# Patient Record
Sex: Female | Born: 1980 | Race: White | Hispanic: Yes | Marital: Married | Smoking: Never smoker
Health system: Southern US, Community
[De-identification: ages and names within clinical notes are randomized; demographics above are authoritative.]

---

## 2013-06-30 DIAGNOSIS — Z9289 Personal history of other medical treatment: Secondary | ICD-10-CM | POA: Insufficient documentation

## 2021-11-13 ENCOUNTER — Other Ambulatory Visit: Payer: Self-pay

## 2021-11-13 DIAGNOSIS — Z1231 Encounter for screening mammogram for malignant neoplasm of breast: Secondary | ICD-10-CM

## 2021-12-27 ENCOUNTER — Ambulatory Visit
Admission: RE | Admit: 2021-12-27 | Discharge: 2021-12-27 | Disposition: A | Discharge status: Home / Self Care | Payer: No Typology Code available for payment source | Source: Ambulatory Visit | Attending: Obstetrics and Gynecology | Admitting: Obstetrics and Gynecology

## 2021-12-27 ENCOUNTER — Other Ambulatory Visit: Payer: Self-pay

## 2021-12-27 ENCOUNTER — Ambulatory Visit: Payer: Self-pay | Admitting: *Deleted

## 2021-12-27 VITALS — BP 128/80 | Wt 202.6 lb

## 2021-12-27 DIAGNOSIS — Z1231 Encounter for screening mammogram for malignant neoplasm of breast: Secondary | ICD-10-CM

## 2021-12-27 DIAGNOSIS — Z01419 Encounter for gynecological examination (general) (routine) without abnormal findings: Secondary | ICD-10-CM

## 2021-12-27 NOTE — Patient Instructions (Signed)
Explained breast self awareness with Cassandra Trevino. Pap smear completed today. Let her know BCCCP will cover Pap smears and HPV typing every 5 years unless has a history of abnormal Pap smears. Referred patient to the [facility] Center of [facility] for a screening mammogram on the mobile unit. Appointment scheduled Thursday, December 27, 2021 at 1000. Patient aware of appointment and will be there. Let patient know will follow up with her within the next couple weeks with results of Pap smear by phone. Informed patient that the Breast Center will follow up with her within the next couple of weeks with results of her mammogram by letter or phone. Cassandra Trevino verbalized understanding. ? ?Gilbert Manolis, Kathaleen Maser, RN ?9:42 AM ? ? ? ? ?

## 2021-12-27 NOTE — Progress Notes (Signed)
Ms. Gardiner Coins is a 41 y.o. No obstetric history on file. female who presents to [facility] clinic today with no complaints.  ?  ?Pap Smear: Pap smear completed today. Last Pap smear was in 2015 at [facility] clinic and was normal per patient. Per patient has no history of an abnormal Pap smear. Last Pap smear result is not available in Epic. ?  ?Physical exam: ?Breasts ?Left breast slightly larger than right breast that per patient is normal for her. No skin abnormalities bilateral breasts. No nipple retraction bilateral breasts. No nipple discharge bilateral breasts. No lymphadenopathy. No lumps palpated bilateral breasts. No complaints of pain or tenderness on exam.     ?  ?Pelvic/Bimanual ?Ext Genitalia ?No lesions, no swelling and no discharge observed on external genitalia. External genitalia slightly reddened.      ?  ?Vagina ?Vagina pink and normal texture. No lesions and blood observed in vagina consistent with patient is currently on menstrual period.      ?  ?Cervix ?Cervix is present. Cervix pink and of normal texture. Blood observed on cervix. ?  ?Uterus ?Uterus is present and palpable. Uterus in normal position and normal size.      ?  ?Adnexae ?Bilateral ovaries present and palpable. No tenderness on palpation.       ?  ?Rectovaginal ?No rectal exam completed today since patient had no rectal complaints. No skin abnormalities observed on exam.   ?  ?Smoking History: ?Patient has never smoked. ?  ?Patient Navigation: ?Patient education provided. Access to services provided for patient through Golf program. Spanish interpreter Natale Lay from [facility] provided.  ?  ?Breast and Cervical Cancer Risk Assessment: ?Patient does not have family history of breast cancer, known genetic mutations, or radiation treatment to the chest before age 72. Patient does not have history of cervical dysplasia, immunocompromised, or DES exposure in-utero. ? ?Risk Assessment   ? ? Risk Scores   ? ?   12/27/2021  ? Last edited  by: Narda Rutherford, LPN  ? 5-year risk: 0.3 %  ? Lifetime risk: 5.7 %  ? ?  ?  ? ?  ? ? ?A: ?BCCCP exam with pap smear ?No complaints. ? ?P: ?Referred patient to the [facility] of [facility] for a screening mammogram on the mobile unit. Appointment scheduled Thursday, December 27, 2021 at 1000. ? ?Priscille Heidelberg, RN ?12/27/2021 9:42 AM   ?

## 2022-01-01 ENCOUNTER — Other Ambulatory Visit: Payer: Self-pay | Admitting: Obstetrics and Gynecology

## 2022-01-01 DIAGNOSIS — R928 Other abnormal and inconclusive findings on diagnostic imaging of breast: Secondary | ICD-10-CM

## 2022-01-01 LAB — CYTOLOGY - PAP
Comment: NEGATIVE
Diagnosis: NEGATIVE
High risk HPV: NEGATIVE

## 2022-01-02 NOTE — Progress Notes (Signed)
Please call patient with normal Pap smear result.

## 2022-01-10 ENCOUNTER — Telehealth: Payer: Self-pay

## 2022-01-10 NOTE — Telephone Encounter (Signed)
Via Gwendolyn Grant interpreter Haroldine Laws), Patient informed negative Pap/HPV results, next pap due in 5 years. Patient verbalized understanding.  ?

## 2022-01-21 ENCOUNTER — Ambulatory Visit
Admission: RE | Admit: 2022-01-21 | Discharge: 2022-01-21 | Disposition: A | Discharge status: Home / Self Care | Payer: No Typology Code available for payment source | Source: Ambulatory Visit | Attending: Obstetrics and Gynecology | Admitting: Obstetrics and Gynecology

## 2022-01-21 ENCOUNTER — Other Ambulatory Visit: Payer: Self-pay | Admitting: Obstetrics and Gynecology

## 2022-01-21 DIAGNOSIS — R928 Other abnormal and inconclusive findings on diagnostic imaging of breast: Secondary | ICD-10-CM

## 2022-01-21 DIAGNOSIS — N6489 Other specified disorders of breast: Secondary | ICD-10-CM

## 2022-01-21 DIAGNOSIS — R921 Mammographic calcification found on diagnostic imaging of breast: Secondary | ICD-10-CM

## 2022-01-29 ENCOUNTER — Ambulatory Visit
Admission: RE | Admit: 2022-01-29 | Discharge: 2022-01-29 | Disposition: A | Discharge status: Home / Self Care | Payer: No Typology Code available for payment source | Source: Ambulatory Visit | Attending: Obstetrics and Gynecology | Admitting: Obstetrics and Gynecology

## 2022-01-29 DIAGNOSIS — N6489 Other specified disorders of breast: Secondary | ICD-10-CM

## 2022-01-29 DIAGNOSIS — R921 Mammographic calcification found on diagnostic imaging of breast: Secondary | ICD-10-CM

## 2022-01-29 HISTORY — PX: BREAST BIOPSY: SHX20

## 2022-02-20 ENCOUNTER — Other Ambulatory Visit: Payer: Self-pay

## 2022-02-20 ENCOUNTER — Inpatient Hospital Stay: Payer: Self-pay | Attending: Obstetrics and Gynecology | Admitting: *Deleted

## 2022-02-20 VITALS — BP 120/82 | Ht 60.0 in | Wt 204.2 lb

## 2022-02-20 DIAGNOSIS — E119 Type 2 diabetes mellitus without complications: Secondary | ICD-10-CM

## 2022-02-20 DIAGNOSIS — Z0000 Encounter for general adult medical examination without abnormal findings: Secondary | ICD-10-CM

## 2022-02-20 DIAGNOSIS — E785 Hyperlipidemia, unspecified: Secondary | ICD-10-CM

## 2022-02-20 HISTORY — DX: Hyperlipidemia, unspecified: E78.5

## 2022-02-20 HISTORY — DX: Type 2 diabetes mellitus without complications: E11.9

## 2022-02-20 NOTE — Progress Notes (Signed)
W[facility]woman initial screening ?  ?Interpreter- Natale Lay, [employer] ?  ?Clinical Measurement: There were no vitals filed for this visit. Fasting Labs Drawn Today, will review with patient when they result. ?  ?Medical History:  Patient states that she  does not know if she has  high cholesterol, does not have high blood pressure and she does not have diabetes. ? ?Medications:  Patient states that she does not take medication to lower cholesterol, blood pressure or blood sugar.  Patient does not take an aspirin a day to help prevent a heart attack or stroke.  ?  ?Blood pressure, self measurement: Patient states that she does not measure blood pressure from home. She checks her blood pressure N/A. She shares her readings with a health care provider: N/A. ?  ?Nutrition: Patient states that on average she eats 1 cups of fruit and 0 cups of vegetables per day. Patient states that she does not eat fish at least 2 times per week. Patient eats less than half servings of whole grains. Patient drinks less than 36 ounces of beverages with added sugar weekly: yes. Patient is currently watching sodium or salt intake: yes. In the past 7 days patient has consumed drinks containing alcohol on 0 days. On a day that patient consumes drinks containing alcohol on average 0 drinks are consumed.     ? ?Physical activity:  Patient states that she gets 0 minutes of moderate and 0 minutes of vigorous physical activity each week. ? ?Smoking status:  Patient states that she has has never smoked . ?  ?Quality of life:  Over the past 2 weeks patient states that she had little interest or pleasure in doing things: not at all. She has been feeling down, depressed or hopeless:not at all.  ?  ?Risk reduction and counseling:   ? ?Health Coaching: Spoke with the patient about adding more fruits and vegetables in daily diet. Showed patient what a serving size looks likes. Patient does not eat fish often. Gave suggestions for heart healthy fish that  patient can try (salmon, tuna, mackerel, sardines or sea bass). Patient does not eat whole grains regularly. Gave suggestions for whole wheat bread, oatmeal, brown rice, whole grain cereals or whole wheat pasta. Patient has not been exercising regularly.  Encouraged patient to try and get 20-30 minutes of exercise daily. ? ?Goal: Patient would like to start walking. Patient wants to walk 2 days a week for 20 minutes at a time over the next 3 months. Patient also wants to work on her diet and eating healthier.  ?  ?Navigation:  I will notify patient of lab results.  Patient is aware of 2 more health coaching sessions and a follow up. ? ?Time: 20 minutes  ?

## 2022-02-21 ENCOUNTER — Telehealth: Payer: Self-pay

## 2022-02-21 LAB — LIPID PANEL
Chol/HDL Ratio: 5.9 ratio — ABNORMAL HIGH (ref 0.0–4.4)
Cholesterol, Total: 240 mg/dL — ABNORMAL HIGH (ref 100–199)
HDL: 41 mg/dL (ref >39)
LDL Chol Calc (NIH): 158 mg/dL — ABNORMAL HIGH (ref 0–99)
Triglycerides: 221 mg/dL — ABNORMAL HIGH (ref 0–149)
VLDL Cholesterol Cal: 41 mg/dL — ABNORMAL HIGH (ref 5–40)

## 2022-02-21 LAB — HEMOGLOBIN A1C
Est. average glucose Bld gHb Est-mCnc: 298 mg/dL
Hgb A1c MFr Bld: 12 % — ABNORMAL HIGH (ref 4.8–5.6)

## 2022-02-21 LAB — GLUCOSE, RANDOM: Glucose: 256 mg/dL — ABNORMAL HIGH (ref 70–99)

## 2022-02-21 NOTE — Telephone Encounter (Signed)
Health coaching 2   interpreter- Natale Lay, [employer]    Labs- 240 cholesterol, 158 LDL cholesterol, 221 triglycerides, 41 HDL cholesterol, 12.0 hemoglobin A1C, 256 mean plasma glucose. Patient understands and is aware of her lab results.   Goals-  1.Reduce the amount of fried and fatty foods. Try to grill, bake, broil or sautee foods instead.  2. Reduce the amount of red meats consumed. Substitute for lean proteins like chicken, fish, [place].  3. Add in more fresh fruits and vegetables into diet. Add in more whole grains into diet.  4. Try to exercise daily for at least 20-30 minutes. 5. Reduce the amount of sweets and sugars consumed in both food and drinks. 6. Reduce the amount of carbs consumed.  Stressed the importance to patient of making changes with diet and exercise to help get her elevated numbers under control. Patient voiced understanding.    Navigation:  Patient is aware of 1 more health coaching sessions and a follow up. Referred patient to Internal Medicine for follow-up for elevated labs. Patient is scheduled on Monday, Feb 25, 2022 @ 8:30 am.  Time-  15 minutes

## 2022-02-25 ENCOUNTER — Ambulatory Visit (INDEPENDENT_AMBULATORY_CARE_PROVIDER_SITE_OTHER): Payer: Self-pay | Admitting: Internal Medicine

## 2022-02-25 ENCOUNTER — Encounter: Payer: Self-pay | Admitting: Internal Medicine

## 2022-02-25 ENCOUNTER — Other Ambulatory Visit (HOSPITAL_COMMUNITY): Payer: Self-pay

## 2022-02-25 VITALS — BP 122/76 | HR 92 | Temp 97.8°F | Ht 60.5 in | Wt 205.5 lb

## 2022-02-25 DIAGNOSIS — E119 Type 2 diabetes mellitus without complications: Secondary | ICD-10-CM | POA: Diagnosis not present

## 2022-02-25 DIAGNOSIS — E785 Hyperlipidemia, unspecified: Secondary | ICD-10-CM | POA: Insufficient documentation

## 2022-02-25 DIAGNOSIS — E782 Mixed hyperlipidemia: Secondary | ICD-10-CM

## 2022-02-25 DIAGNOSIS — Z7984 Long term (current) use of oral hypoglycemic drugs: Secondary | ICD-10-CM

## 2022-02-25 MED ORDER — INSULIN DETEMIR 100 UNIT/ML ~~LOC~~ SOLN
16.0000 [IU] | Freq: Every day | SUBCUTANEOUS | 3 refills | Status: DC
  Filled 2022-02-25: qty 10, 30d supply, fill #0

## 2022-02-25 MED ORDER — BLOOD GLUCOSE MONITOR SYSTEM W/DEVICE KIT
PACK | 0 refills | Status: DC
  Filled 2022-02-25: qty 1, 1d supply, fill #0

## 2022-02-25 MED ORDER — METFORMIN HCL 500 MG PO TABS
ORAL_TABLET | ORAL | 3 refills | Status: DC
  Filled 2022-02-25: qty 90, 21d supply, fill #0

## 2022-02-25 MED ORDER — ACCU-CHEK GUIDE ME W/DEVICE KIT
1.0000 [IU] | PACK | Freq: Every day | 0 refills | Status: AC
  Filled 2022-02-25: qty 1, fill #0
  Filled 2022-02-25: qty 1, 30d supply, fill #0

## 2022-02-25 MED ORDER — ROSUVASTATIN CALCIUM 10 MG PO TABS
10.0000 mg | ORAL_TABLET | Freq: Every day | ORAL | 3 refills | Status: AC
  Filled 2022-02-25: qty 30, 30d supply, fill #0
  Filled 2022-04-08: qty 30, 30d supply, fill #1
  Filled 2022-07-01: qty 30, 30d supply, fill #2

## 2022-02-25 MED ORDER — CONTOUR NEXT GEN MONITOR W/DEVICE KIT
1.0000 [IU] | PACK | Freq: Every day | 0 refills | Status: DC
  Filled 2022-02-25: qty 1, 30d supply, fill #0

## 2022-02-25 MED ORDER — ACCU-CHEK SOFTCLIX LANCETS MISC
12 refills | Status: AC
  Filled 2022-02-25: qty 100, 30d supply, fill #0

## 2022-02-25 MED ORDER — LEVEMIR FLEXPEN 100 UNIT/ML ~~LOC~~ SOPN
16.0000 [IU] | PEN_INJECTOR | Freq: Every day | SUBCUTANEOUS | 11 refills | Status: DC
  Filled 2022-02-25 – 2022-03-25 (×2): qty 3, 18d supply, fill #0

## 2022-02-25 MED ORDER — ACCU-CHEK GUIDE VI STRP
ORAL_STRIP | 12 refills | Status: AC
  Filled 2022-02-25: qty 50, 50d supply, fill #0
  Filled 2022-04-08: qty 50, 50d supply, fill #1

## 2022-02-25 MED ORDER — UNIFINE PENTIPS 32G X 6 MM MISC
3 refills | Status: DC
  Filled 2022-02-25: qty 100, 90d supply, fill #0

## 2022-02-25 MED ORDER — INSULIN GLARGINE 100 UNIT/ML ~~LOC~~ SOLN
16.0000 [IU] | Freq: Every day | SUBCUTANEOUS | 3 refills | Status: DC
  Filled 2022-02-25: qty 10, 28d supply, fill #0

## 2022-02-25 MED ORDER — METFORMIN HCL 500 MG PO TABS
ORAL_TABLET | ORAL | 3 refills | Status: AC
  Filled 2022-02-25: qty 120, 30d supply, fill #0
  Filled 2022-04-08: qty 120, 30d supply, fill #1
  Filled 2022-07-01: qty 120, 30d supply, fill #2

## 2022-02-25 NOTE — Patient Instructions (Addendum)
To Mrs. Cassandra Trevino,   It was nice meeting you today! Today we discussed your blood sugars and cholesterol levels.   For your blood sugars, I am going to start you on two medications called metformin and insulin.   Metformin: this medication we will increase over several weeks. Week 1: Please take one tablet with breakfast for 7 days.  Week 2: Please take one tablet with breakfast and one tablet with dinner for 7 days.  Week 3: Please take 2 tablets with breakfast and one tablet with dinner for 7 days.  Week 4: Please take 2 tablets with breakfast and 2 tablets with dinner until our next visit.   Insulin:  This medication is called Glargine, and is a long lasting insulin. Please inject 16 units nightly. You will also receive a glucose monitoring device to check your blood sugars. Please check your blood sugar daily before eating a meal. This medication can make your blood sugar low, if you feel like you are sweating, your heart is racing, or you do not feel well. Please check your blood sugar, and if it is below 80, please drink an orange juice, or a candy to increase your blood sugar. We will check your blood sugar log in one month to see how they are doing.   Your cholesterol level was also increased, and we are going to start a cholesterol medication called Rosuvastatin, please take one pill daily.   We also discussed lifestyle changes today, and I will attach a document to help you with lifestyle changes.   We will see you back in one month.  Have a good day! Cassandra Amen, MD  Fue un placer conocerte hoy! Hoy discutimos sus niveles de azcar y Archivist.  Para sus niveles de azcar en la sangre, voy a comenzar con dos medicamentos llamados metformina e insulina.  Metformina: este medicamento lo iremos incrementando a lo largo de Psychologist, educational. Semana 1: tome una tableta con el desayuno durante 7 das. Semana 2: tome una tableta con el desayuno y [place] tableta con la cena  durante 7 [address]  [address] Semana 3: tome 2 tabletas con el desayuno y [place] tableta con la cena durante 7 [address]  [address]. Semana 4: tome 2 tabletas con el desayuno y 2 tabletas con la cena hasta nuestra prxima visita.  Insulina: Este medicamento se llama Ephriam Knuckles y es una insulina de larga duracin. Inyecte 16 unidades todas las noches. Tambin recibir un dispositivo de monitoreo de glucosa para Chief Operating Officer sus niveles de Banker. Por favor revise su nivel de azcar en la sangre diariamente antes de comer. Este medicamento puede hacer que le baje el nivel de azcar en la sangre, si siente que est sudando, si su corazn est acelerado o si no se siente bien. Controle su nivel de azcar en la sangre y, si est por debajo de 80, tome un jugo de naranja o un dulce para aumentar su nivel de Banker. Verificaremos su registro de Banker en un mes para ver cmo estn.  Su nivel de colesterol tambin aument y vamos a comenzar a Set designer para el colesterol llamado rosuvastatina, tome una pastilla al da.  Tambin discutimos los cambios de estilo de vida [place], y adjuntar un documento para ayudarlo con los cambios de estilo de [place].  Nos vemos de vuelta en un mes. Que tenga un buen da! Cassandra Dural, MD

## 2022-02-25 NOTE — Assessment & Plan Note (Signed)
Patient with known diabetes, with recent lipid panel showing total cholesterol of 240, triglycerides 244, HDL 41, LDL 158 with an ASCVD risk of 2.6%, but given diabetes we will start moderate dose statin. - Start simvastatin 10 mg daily

## 2022-02-25 NOTE — Progress Notes (Signed)
CC: Elevated Blood Sugar and Cholesterol Levels  HPI:  Cassandra Trevino is a 41 y.o. person, with a PMH noted below, who presents to the clinic Elevated Blood Sugar and Cholesterol Levels. To see the management of their acute and chronic conditions, please see the A&P note under the Encounters tab.    Review of Systems:   Review of Systems  Constitutional:  Negative for chills, fever and weight loss.  Eyes:  Negative for blurred vision, double vision and photophobia.  Respiratory:  Negative for cough, hemoptysis and sputum production.   Cardiovascular:  Negative for chest pain, palpitations and orthopnea.  Gastrointestinal:  Negative for abdominal pain, constipation, diarrhea, nausea and vomiting.  Neurological:  Negative for dizziness, tingling and headaches.    Medications:  None  PMH:  Sugar level high during pregnancy  FH:  HTN: Mother DM: Mother Stroke: None Cancer: None  SH:  Housing: [place], Husband and 3 children who are 88, 14,8  Occupation: Not working currently, but worked at a [employer]  Tobacco: None ETOH: None Drugs: None  Physical Exam:  Vitals:   02/25/22 0833  BP: 122/76  Pulse: 92  Temp: 97.8 F (36.6 C)  TempSrc: Oral  SpO2: 99%  Weight: 205 lb 8 oz (93.2 kg)  Height: 5' 0.5" (1.537 m)   Physical Exam Constitutional:      General: She is not in acute distress.    Appearance: Normal appearance. She is obese. She is not ill-appearing or toxic-appearing.  HENT:     Head: Normocephalic and atraumatic.  Cardiovascular:     Rate and Rhythm: Normal rate and regular rhythm.     Pulses: Normal pulses.     Heart sounds: Normal heart sounds. No murmur heard.   No friction rub. No gallop.  Pulmonary:     Effort: Pulmonary effort is normal. No respiratory distress.     Breath sounds: Normal breath sounds. No stridor. No wheezing or rhonchi.  Abdominal:     General: Abdomen is flat. Bowel sounds are normal.     Palpations: Abdomen  is soft.     Tenderness: There is no abdominal tenderness.  Musculoskeletal:     Right lower leg: No edema.     Left lower leg: No edema.  Neurological:     Mental Status: She is alert and oriented to person, place, and time.  Psychiatric:        Mood and Affect: Mood normal.        Behavior: Behavior normal.     Assessment & Plan:   See Encounters Tab for problem based charting.  Patient discussed with Dr. Mikey Bussing  Diabetes [facility]) Patient presents today with hemoglobin A1c of 12.0.  She states that she is unsure if she had diabetes in the past, but she states that she did have high blood sugars with her last child, 8 years ago. Currently not on any medications at this time. Today we had an extensive conversation on long-term consequences of untreated diabetes and the need for management. Patient is willing to undergo lifestyle changes, as well as start medications.  Patient states she does not have insurance, has not received the orange card packet yet.  Given her BMI of 39.47, she would additionally benefit from a GLP-1A in the future for the benefit of weight loss as well. - Start Lantus 16 units at night and program - Metformin 500 mg daily, uptitrating weekly for total of 1000 mg twice daily - Glucometer with supplies ordered,  use once daily in the a.m. and as needed for hypoglycemic events, for IM program - Diabetes blood sugar monitoring, action plan, diet, lifestyle changes packets given to patient - We will follow-up in 4 weeks to further evaluate sugars - Consider diabetes coordinator once patient completes orange card packet.  Addendum: Received notification from pharmacy that patient has [facility], and that Lantus and Contour devices are not preferred through [employer].  Reordered Levemir FlexPen 16 units nightly, with needles, as well as Accu-Chek with supplies ordered. - Levemir FlexPen 16 units nightly, with needles - Accu-Chek ordered with lancets and strips - Follow-up  in 4 weeks  HLD (hyperlipidemia) Patient with known diabetes, with recent lipid panel showing total cholesterol of 240, triglycerides 244, HDL 41, LDL 158 with an ASCVD risk of 2.6%, but given diabetes we will start moderate dose statin. - Start simvastatin 10 mg daily

## 2022-02-25 NOTE — Assessment & Plan Note (Addendum)
Patient presents today with hemoglobin A1c of 12.0.  She states that she is unsure if she had diabetes in the past, but she states that she did have high blood sugars with her last child, 8 years ago. Currently not on any medications at this time. Today we had an extensive conversation on long-term consequences of untreated diabetes and the need for management. Patient is willing to undergo lifestyle changes, as well as start medications.  Patient states she does not have insurance, has not received the orange card packet yet. Given her BMI of 39.47, she would additionally benefit from a GLP-1A in the future for the benefit of weight loss as well.  We will assess kidney function in the setting of starting metformin, which patient is agreeable to obtaining. - Start Lantus 16 units at night and program - Metformin 500 mg daily, uptitrating weekly for total of 1000 mg twice daily - Glucometer with supplies ordered, use once daily in the a.m. and as needed for hypoglycemic events, for IM program - Diabetes blood sugar monitoring, action plan, diet, lifestyle changes packets given to patient - We will follow-up in 4 weeks to further evaluate sugars - Consider diabetes coordinator once patient completes orange card packet.  Addendum: Received notification from pharmacy that patient has [facility], and that Lantus and Contour devices are not preferred through [employer].  Reordered Levemir FlexPen 16 units nightly, with needles, as well as Accu-Chek with supplies ordered. - Levemir FlexPen 16 units nightly, with needles - Accu-Chek ordered with lancets and strips - Follow-up in 4 weeks

## 2022-02-26 ENCOUNTER — Other Ambulatory Visit (HOSPITAL_COMMUNITY): Payer: Self-pay

## 2022-02-26 LAB — BMP8+ANION GAP
Anion Gap: 12 mmol/L (ref 10.0–18.0)
BUN/Creatinine Ratio: 18 (ref 9–23)
BUN: 9 mg/dL (ref 6–24)
CO2: 21 mmol/L (ref 20–29)
Calcium: 8.9 mg/dL (ref 8.7–10.2)
Chloride: 99 mmol/L (ref 96–106)
Creatinine, Ser: 0.5 mg/dL — ABNORMAL LOW (ref 0.57–1.00)
Glucose: 254 mg/dL — ABNORMAL HIGH (ref 70–99)
Potassium: 4.6 mmol/L (ref 3.5–5.2)
Sodium: 132 mmol/L — ABNORMAL LOW (ref 134–144)
eGFR: 122 mL/min/{1.73_m2} (ref >59)

## 2022-03-07 NOTE — Progress Notes (Signed)
Internal Medicine Clinic Attending  Case discussed with the resident at the time of the visit.  We reviewed the resident's history and exam and pertinent patient test results.  I agree with the assessment, diagnosis, and plan of care documented in the resident's note.  

## 2022-03-25 ENCOUNTER — Other Ambulatory Visit (HOSPITAL_COMMUNITY): Payer: Self-pay

## 2022-03-25 ENCOUNTER — Encounter: Payer: Self-pay | Admitting: Student

## 2022-03-25 ENCOUNTER — Ambulatory Visit (INDEPENDENT_AMBULATORY_CARE_PROVIDER_SITE_OTHER): Payer: Self-pay | Admitting: Student

## 2022-03-25 VITALS — BP 123/79 | HR 84 | Temp 98.1°F | Ht 60.5 in | Wt 204.3 lb

## 2022-03-25 DIAGNOSIS — Z7984 Long term (current) use of oral hypoglycemic drugs: Secondary | ICD-10-CM

## 2022-03-25 DIAGNOSIS — Z5989 Other problems related to housing and economic circumstances: Secondary | ICD-10-CM

## 2022-03-25 DIAGNOSIS — E119 Type 2 diabetes mellitus without complications: Secondary | ICD-10-CM

## 2022-03-25 DIAGNOSIS — E1169 Type 2 diabetes mellitus with other specified complication: Secondary | ICD-10-CM

## 2022-03-25 DIAGNOSIS — E782 Mixed hyperlipidemia: Secondary | ICD-10-CM

## 2022-03-25 NOTE — Progress Notes (Unsigned)
   CC: Diabetes follow-up  HPI:  [name] is a 41 y.o. female with PMH as below who presents for follow-up on her uncontrolled diabetes. Please see problem based charting for evaluation, assessment and plan.  Encounter completed via Spanish video interpreter: Hillery Hunter #[id number]  Past Medical History:  Diagnosis Date   Hyperlipidemia 02/20/2022   Type 2 diabetes mellitus (HCC) 02/20/2022   Initial A1c 12.0%    Review of Systems:  Constitutional: Negative for fatigue or polydipsia Eyes: Negative for visual changes MSK: Negative for back pain GU: Negative for polyuria Abdomen: Negative for abdominal pain, constipation or diarrhea Neuro: Negative for headache, dizziness or weakness  Physical Exam: General: Pleasant, well-appearing Spanish-speaking woman accompanied by son and daughter. No acute distress. Cardiac: RRR. No murmurs, rubs or gallops. No LE edema Respiratory: Lungs CTAB. Decreased air movement. No wheezing or crackles. Abdominal: Abdominal obesity. Soft, and non tender. Normal BS. Skin: Warm, dry and intact without rashes or lesions Extremities: Atraumatic. Full ROM. Palpable radial and DP pulses. Neuro: A&O x 3. Moves all extremities. Normal sensation to gross touch. Psych: Appropriate mood and affect.  Vitals:   03/25/22 0845  BP: 123/79  Pulse: 84  Temp: 98.1 F (36.7 C)  TempSrc: Oral  SpO2: 97%  Weight: 204 lb 4.8 oz (92.7 kg)  Height: 5' 0.5" (1.537 m)    Assessment & Plan:   See Encounters Tab for problem based charting.  Patient discussed with Dr. Lewie Chamber, MD, MPH

## 2022-03-25 NOTE — Patient Instructions (Addendum)
Thank you, Ms.Juluis Pitch Sosa for allowing [place] to provide your care today. Today, we discussed your diabetes and cholesterol. It looks like someone apply for Medicare for you. Since you have this insurance coverage, you do not qualify for our orange card. I am referring you to our social worker to help [place] figure out why you are currently on this insurance.  Continue taking all your medications as prescribed. Continue reducing the amount of sugar and carbs in your diet as well as exercising daily to help improve your blood sugar.  My Chart Access: [email or web address]  Please follow-up in 4 weeks.  Please make sure to arrive 15 minutes prior to your next appointment. If you arrive late, you may be asked to reschedule.    We look forward to seeing you next time. Please call our clinic at [phone] if you have any questions or concerns. The best time to call is Monday-Friday from 9am-4pm, but there is someone available 24/7. If after hours or the weekend, call the main hospital number and ask for the Internal Medicine Resident On-Call. If you need medication refills, please notify your pharmacy one week in advance and they will send [place] a request.   Thank you for letting us take part in your care. Wishing you the best!  Steffanie Rainwater, MD 03/25/2022, 9:28 AM IM Resident, PGY-2 [place] 41:10  Joesph July. [place] Sosa por permitirnos brindarle su atencin hoy. Hoy hablamos de su diabetes y colesterol. Parece que alguien solicit Medicare por usted. Como tiene esta cobertura de seguro, no califica para nuestra tarjeta naranja. Lo estoy refiriendo a Product/process development scientist social para que nos ayude a Financial risk analyst por qu est actualmente en este seguro.  Contine tomando todos sus medicamentos segn lo prescrito. Contine reduciendo la cantidad de azcar y carbohidratos en su dieta y haga ejercicio todos los das para ayudar a mejorar su nivel de Teacher, English as a foreign language.  Acceso a mi grfico: [email or web address]  Por favor, seguimiento en 4 semanas.  Asegrese de llegar 15 minutos antes de su prxima cita. Si llega tarde, es posible que se Armed forces operational officer.  Esperamos verte la prxima vez. Llame a nuestra clnica al [phone] si tiene alguna pregunta o inquietud. El mejor horario para llamar es de lunes a viernes de 9 a. m. a 4 p. m., pero hay alguien disponible las 24 horas del da, los 7 [address]  [address] de la [place]. Si es fuera del horario de atencin o durante el fin de [place], llame al nmero principal del hospital y pregunte por el residente de guardia de medicina interna. Si necesita reposicin de medicamentos, por favor notifique a su farmacia con una semana de anticipacin y ellos nos enviarn una solicitud.  Gracias por dejarnos participar en su cuidado. Deseandote lo mejor!  Steffanie Rainwater, MD 19/03/2022, 09:28 Residente MI, PGY-2 [place] 41:10

## 2022-03-26 ENCOUNTER — Encounter: Payer: Self-pay | Admitting: Student

## 2022-03-26 ENCOUNTER — Telehealth: Payer: Self-pay | Admitting: *Deleted

## 2022-03-26 NOTE — Chronic Care Management (AMB) (Signed)
  Care Management   Note  03/26/2022 Name: Cassandra Trevino MRN: [id number] DOB: May 21, 1981  Cassandra Trevino is a 41 y.o. year old female who is a primary care patient of Masters, Katie, DO. I reached out to [employer] by phone today offer care coordination services.   Cassandra Trevino was given information about care management services today including:  Care management services include personalized support from designated clinical staff supervised by her physician, including individualized plan of care and coordination with other care providers 24/7 contact phone numbers for assistance for urgent and routine care needs. The patient may stop care management services at any time by phone call to the office staff.  Patient agreed to services and verbal consent obtained.   Follow up plan: Telephone appointment with care management team member scheduled for:03/28/22  [facility] Guide, Embedded Care Coordination [facility]  Care Management  Direct Dial: [phone]

## 2022-03-26 NOTE — Assessment & Plan Note (Signed)
Patient here for diabetes follow-up. Patient states she has been tolerating escalating dose of metformin and currently taking 1000 mg twice a day. States she tried to pick up her insulin however it would cost her $60-$70 for a 15-day supply which she could not afford. She has been working on dietary changes to help improve her blood sugar. She checks her fasting blood sugar each morning. Printout of patient's meter shows an average blood sugar of 157, highest of 249 and lowest of 123. She denied any polyuria, polydipsia or blurry vision. Called [facility] about patient's coverage and I was informed that when they run the prescription for the insulin, it looks like someone has signed patient up for [facility]. This disqualify patient from being a candidate for the IM program. Discussed with patient who states she is unaware of any coverage and does not remember signing up for it. Since patient unable to afford medications with this coverage, it has become a barrier to her access to care. [employer] Medicare number given to patient so she can have someone help her call to clarify coverage and remove it. Plan to also request the assistance of CSW to help cancel patient's current coverage. Patient states she is currently working on the orange card application. Once insurance coverage has been fixed, we will consider starting patient on GLP-1 agonist to help with weight loss.  Plan: -Continue metformin 1000 mg twice daily -Continue lifestyle modifications with exercise and dietary changes -Pending insurance coverage for insulin -Referral to community care coordination -Follow-up in 4 weeks for reevaluation -Repeat A1c in August

## 2022-03-26 NOTE — Assessment & Plan Note (Signed)
Patient reports adherence and tolerance of rosuvastatin 20 mg daily.  States she has been cutting down fatty foods and eating more vegetables. -Continue rosuvastatin 10 mg daily -Repeat lipid panel in 6 months

## 2022-03-28 ENCOUNTER — Ambulatory Visit: Payer: Self-pay | Admitting: Licensed Clinical Social Worker

## 2022-04-04 ENCOUNTER — Ambulatory Visit: Payer: Self-pay | Admitting: Licensed Clinical Social Worker

## 2022-04-04 NOTE — Patient Instructions (Signed)
Visit Information  Instructions:   Patient was given the following information about care management and care coordination services today, agreed to services, and gave verbal consent: 1.care management/care coordination services include personalized support from designated clinical staff supervised by their physician, including individualized plan of care and coordination with other care providers 2. 24/7 contact phone numbers for assistance for urgent and routine care needs. 3. The patient may stop care management/care coordination services at any time by phone call to the office staff.  Patient verbalizes understanding of instructions and care plan provided today and agrees to view in MyChart. Active MyChart status and patient understanding of how to access instructions and care plan via MyChart confirmed with patient.     No additional follow up needed.  Christen Butter, BSW  Social Worker [facility]  [phone]

## 2022-04-04 NOTE — Patient Instructions (Signed)
Visit Information  Instructions: patient will work with SW to address concerns related to Orange Card   Patient was given the following information about care management and care coordination services today, agreed to services, and gave verbal consent: 1.care management/care coordination services include personalized support from designated clinical staff supervised by their physician, including individualized plan of care and coordination with other care providers 2. 24/7 contact phone numbers for assistance for urgent and routine care needs. 3. The patient may stop care management/care coordination services at any time by phone call to the office staff.  Patient verbalizes understanding of instructions and care plan provided today and agrees to view in MyChart. Active MyChart status and patient understanding of how to access instructions and care plan via MyChart confirmed with patient.     The care management team will reach out to the patient again over the next 14 days.   Murat Rideout, BSW, MSW  Social Worker [facility]  [phone]      

## 2022-04-04 NOTE — Chronic Care Management (AMB) (Signed)
  Care Management   Social Work Visit Note  03/28/2022 Name: Cassandra Trevino MRN: [id number] DOB: 1981-01-23  Cassandra Trevino is a 40 y.o. year old female who sees Masters, Florentina Addison, DO for primary care. The care management team was consulted for assistance with care management and care coordination needs related to [facility] Resources    Patient was given the following information about care management and care coordination services today, agreed to services, and gave verbal consent: 1.care management/care coordination services include personalized support from designated clinical staff supervised by their physician, including individualized plan of care and coordination with other care providers 2. 24/7 contact phone numbers for assistance for urgent and routine care needs. 3. The patient may stop care management/care coordination services at any time by phone call to the office staff.  Engaged with patient by telephone for initial visit in response to provider referral for social work chronic care management and care coordination services.  Assessment: Review of patient history, allergies, and health status during evaluation of patient need for care management/care coordination services.    Interventions:  Patient interviewed and appropriate assessments performed Collaborated with clinical team regarding patient needs  Late Entry in error by SW. Successful outreach to patient on today. Interpreter was used. Patients spouse answered initially, but requested patient to come to phone.  SW discussed [employer]. Patient stated she was having difficulty with Social Services.  After completed SDOH screen and needs assessment, SW called Social Services and spoke with a representative to get clarity for patient.  SW returned patient call and explained patient needs to have an ID with current address to get assistance.  Patient stated she would work on that. SW advised she would follow up on  06/29 to see if anything additional is needed.  SDOH (Social Determinants of Health) assessments performed: Yes     Plan:  Follow up with patient by 06/29 regarding ID.  Ander Gaster, MSW  Social Worker [facility]  [phone]

## 2022-04-04 NOTE — Chronic Care Management (AMB) (Signed)
  Care Management   Social Work Visit Note  04/04/2022 Name: Cassandra Trevino MRN: [id number] DOB: 1981/05/27  Cassandra Trevino is a 41 y.o. year old female who sees Masters, Florentina Addison, DO for primary care. The care management team was consulted for assistance with care management and care coordination needs related to [facility] Resources    Patient was given the following information about care management and care coordination services today, agreed to services, and gave verbal consent: 1.care management/care coordination services include personalized support from designated clinical staff supervised by their physician, including individualized plan of care and coordination with other care providers 2. 24/7 contact phone numbers for assistance for urgent and routine care needs. 3. The patient may stop care management/care coordination services at any time by phone call to the office staff.  Engaged with patient by telephone for follow up visit in response to provider referral for social work chronic care management and care coordination services.  Assessment: Review of patient history, allergies, and health status during evaluation of patient need for care management/care coordination services.    Interventions:  Patient interviewed and appropriate assessments performed Collaborated with clinical team regarding patient needs  Successful outreach to patient on today. Interpreter [phone] Patient is still working on getting her ID with address. Patient denied needing assistance with process. SW advised if further assistance is needed for patient to contact SW. SW educated patient on [place] Card process reminding patient to complete application process ASAP.   Plan:  No further follow up needed at this time. Patient will outreach SW if further assistance is needed in future.  Ander Gaster, MSW  Social Worker [facility]  [phone]

## 2022-04-08 ENCOUNTER — Other Ambulatory Visit (HOSPITAL_COMMUNITY): Payer: Self-pay

## 2022-04-22 ENCOUNTER — Ambulatory Visit: Payer: Self-pay | Admitting: Student

## 2022-04-22 ENCOUNTER — Other Ambulatory Visit (HOSPITAL_COMMUNITY): Payer: Self-pay

## 2022-04-22 VITALS — BP 110/68 | HR 89 | Wt 205.0 lb

## 2022-04-22 DIAGNOSIS — Z7984 Long term (current) use of oral hypoglycemic drugs: Secondary | ICD-10-CM

## 2022-04-22 DIAGNOSIS — E119 Type 2 diabetes mellitus without complications: Secondary | ICD-10-CM

## 2022-04-22 DIAGNOSIS — E1369 Other specified diabetes mellitus with other specified complication: Secondary | ICD-10-CM

## 2022-04-22 MED ORDER — LIRAGLUTIDE 18 MG/3ML ~~LOC~~ SOPN
PEN_INJECTOR | SUBCUTANEOUS | 5 refills | Status: AC
  Filled 2022-04-22: qty 6, 30d supply, fill #0

## 2022-04-22 NOTE — Progress Notes (Signed)
CC: diabetes follow up  HPI:  [name] is a 41 y.o. female living with a history stated below and presents today for diabetes follow up. Please see problem based assessment and plan for additional details. Interpretor services used during exam  Past Medical History:  Diagnosis Date   Hyperlipidemia 02/20/2022   Type 2 diabetes mellitus ([place]) 02/20/2022   Initial A1c 12.0%    Current Outpatient Medications on File Prior to Visit  Medication Sig Dispense Refill   Accu-Chek Softclix Lancets lancets Check once daily in the morning, and as needed for hypoglycemia 100 each 12   Blood Glucose Monitoring Suppl (ACCU-CHEK GUIDE ME) w/Device KIT Use as directed 1 kit 0   glucose blood (ACCU-CHEK GUIDE) test strip Please check once daily in the morning, and as needed for hypoglycemia 100 each 12   metFORMIN (GLUCOPHAGE) 500 MG tablet Take 1 tablet (500 mg total) by mouth daily with breakfast for 7 days, THEN 1 tablet (500 mg total) 2 (two) times daily with a meal for 7 days, THEN 2 tablets (1,000 mg total) 2 (two) times daily with a meal for 7 days. 120 tablet 3   rosuvastatin (CRESTOR) 10 MG tablet Take 1 tablet (10 mg total) by mouth daily. 90 tablet 3   No current facility-administered medications on file prior to visit.    Family History  Problem Relation Age of Onset   Diabetes Mother    Hypertension Mother    Breast cancer Neg Hx     Social History   Socioeconomic History   Marital status: Married    Spouse name: Not on file   Number of children: 3   Years of education: Not on file   Highest education level: 6th grade  Occupational History   Not on file  Tobacco Use   Smoking status: Never   Smokeless tobacco: Never  Vaping Use   Vaping Use: Never used  Substance and Sexual Activity   Alcohol use: Not Currently   Drug use: Never   Sexual activity: Yes    Birth control/protection: I.U.D.  Other Topics Concern   Not on file  Social History Narrative    Not on file   Social Determinants of Health   Financial Resource Strain: Not on file  Food Insecurity: No Food Insecurity (04/04/2022)   Hunger Vital Sign    Worried About Running Out of Food in the Last Year: Never true    Ran Out of Food in the Last Year: Never true  Transportation Needs: No Transportation Needs (04/04/2022)   PRAPARE - Hydrologist (Medical): No    Lack of Transportation (Non-Medical): No  Physical Activity: Not on file  Stress: Not on file  Social Connections: Not on file  Intimate Partner Violence: Not on file    Review of Systems: ROS negative except for what is noted on the assessment and plan.  Vitals:   04/22/22 0908  BP: 110/68  Pulse: 89  SpO2: 99%  Weight: 205 lb (93 kg)    Physical Exam: Constitutional:in no acute distress HENT: normocephalic atraumatic, mucous membranes moist Eyes: conjunctiva non-erythematous Neck: supple Cardiovascular: regular rate and rhythm, no m/r/g Pulmonary/Chest: normal work of breathing on room air MSK: normal bulk and tone Neurological: alert & oriented x 3 Skin: warm and dry Psych: normal mood  Assessment & Plan:   Type 2 diabetes mellitus (HCC) Assessment: Following up for her management of type 2 DM. A1c of 12.0% two  months ago. Tolerating metformin well, denies GI side effects. Patient had pended [employer] and did not qualify for our $4 program so insulin was held off. She brings in morning fasting glucose readings that are 120-150's. With these readings believe she would benefit from GLP-1 rather than insulin. She is no longer insured and plan is to apply for orange card. Will start victoza from [facility] $4 list. Starting with .6 mg daily for one week and if no side effects titrate up to 1.2 mg daily until she follows up in the clinic next month. Will also place referral for her to meet with Butch Penny DM coordinator in our clinic.   Plan -continue metformin 1000 mg BID.  -start  victoza .84m daily and uptitrate to 1.2 mg daily if tolerable -A1c and follow up in one month -diabetes coordinator referral in place  Patient discussed with Dr. EDelano Metz D.O. [facility], PGY-3 Phone: [phone] 04/22/2022 Time 10:12 AM

## 2022-04-22 NOTE — Assessment & Plan Note (Signed)
Assessment: Following up for her management of type 2 DM. A1c of 12.0% two months ago. Tolerating metformin well, denies GI side effects. Patient had pended [employer] and did not qualify for our $4 program so insulin was held off. She brings in morning fasting glucose readings that are 120-150's. With these readings believe she would benefit from GLP-1 rather than insulin. She is no longer insured and plan is to apply for orange card. Will start victoza from [facility] $4 list. Starting with .6 mg daily for one week and if no side effects titrate up to 1.2 mg daily until she follows up in the clinic next month. Will also place referral for her to meet with Lupita Leash DM coordinator in our clinic.   Plan -continue metformin 1000 mg BID.  -start victoza .6mg  daily and uptitrate to 1.2 mg daily if tolerable -A1c and follow up in one month -diabetes coordinator referral in place

## 2022-04-22 NOTE — Patient Instructions (Signed)
Cassandra Pock, [name] por permitirnos brindarle atencin hoy. hoy discutimos  Diabetes  Por favor, tome la victoza diariamente. Durante la primera semana, inyecte 0,6 al da. Si no hay efectos secundarios y [address], aumente a 1,2 diarios hasta que nos vea en un mes. Por favor llame con cualquier pregunta. Lo programaremos para que se rena con Judge Stall coordinadora de diabetes.  Por favor contine tomando su metformina diariamente. 2 tabletas, 2 veces al da con las comidas.  por favor regrese a la clnica en un mes    He ordenado los siguientes laboratorios para usted:  Lab Orders  No laboratory test(s) ordered today     Pruebas ordenadas hoy:  Referencias ordenadas hoy:  Referral Orders  No referral(s) requested today     He ordenado el siguiente medicamento/cambiado los siguientes medicamentos:  Suspender los siguientes medicamentos: Medications Discontinued During This Encounter  Medication Reason   Insulin Pen Needle (UNIFINE PENTIPS) 32G X 6 MM MISC    insulin detemir (LEVEMIR FLEXPEN) 100 UNIT/ML FlexPen      Iniciar los siguientes medicamentos: Meds ordered this encounter  Medications   liraglutide (VICTOZA) 18 MG/3ML SOPN    Sig: Inject 0.6 mg into the skin daily for 7 days, THEN 1.2 mg daily.    Dispense:  3 mL    Refill:  5    IM program     Seguimiento  1 Month      Si tiene alguna pregunta o inquietud, llame a la clnica de medicina interna al [phone].     Thalia Bloodgood, D.O. [facility]

## 2022-04-23 ENCOUNTER — Other Ambulatory Visit (HOSPITAL_COMMUNITY): Payer: Self-pay

## 2022-04-23 MED ORDER — UNIFINE PENTIPS 31G X 5 MM MISC
0 refills | Status: AC
  Filled 2022-04-23: qty 100, 30d supply, fill #0

## 2022-04-24 NOTE — Progress Notes (Signed)
Internal Medicine Clinic Attending  Case discussed with the resident at the time of the visit.  We reviewed the resident's history and exam and pertinent patient test results.  I agree with the assessment, diagnosis, and plan of care documented in the resident's note.  

## 2022-06-03 ENCOUNTER — Encounter: Payer: Self-pay | Admitting: Internal Medicine

## 2022-06-26 ENCOUNTER — Telehealth: Payer: Self-pay

## 2022-06-26 NOTE — Telephone Encounter (Signed)
Health Coaching 3  interpreter- Rudene Anda, [employer]   Goals- Patient has been working on consuming less carbs (bread).    New goal- Exercising/ walks 5 days a week for 20-30 minutes. Continuing watching the amount of sweets and sugary foods and drinks consumed as well as carb rich foods.  Barrier to reaching goal-    Strategies to overcome-    Navigation:  Patient is aware of  a follow up session. Patient is scheduled for FU on Wednesday, November 8 @ 10:00 am.   Time- 10 minutes

## 2022-06-26 NOTE — Telephone Encounter (Signed)
duplicate

## 2022-07-01 ENCOUNTER — Other Ambulatory Visit (HOSPITAL_COMMUNITY): Payer: Self-pay

## 2022-08-14 ENCOUNTER — Inpatient Hospital Stay: Payer: Self-pay

## 2022-09-04 ENCOUNTER — Inpatient Hospital Stay: Payer: Self-pay | Attending: Obstetrics and Gynecology

## 2022-11-14 ENCOUNTER — Other Ambulatory Visit: Payer: Self-pay | Admitting: Obstetrics and Gynecology

## 2022-11-14 DIAGNOSIS — N6489 Other specified disorders of breast: Secondary | ICD-10-CM

## 2022-11-27 ENCOUNTER — Other Ambulatory Visit: Payer: Self-pay | Admitting: Obstetrics and Gynecology

## 2022-11-27 ENCOUNTER — Ambulatory Visit
Admission: RE | Admit: 2022-11-27 | Discharge: 2022-11-27 | Disposition: A | Discharge status: Home / Self Care | Payer: No Typology Code available for payment source | Source: Ambulatory Visit | Attending: Obstetrics and Gynecology | Admitting: Obstetrics and Gynecology

## 2022-11-27 DIAGNOSIS — N6489 Other specified disorders of breast: Secondary | ICD-10-CM

## 2023-08-12 ENCOUNTER — Encounter: Payer: Self-pay | Admitting: Internal Medicine

## 2024-03-19 IMAGING — MG MM BREAST LOCALIZATION CLIP
4 series · 4 of 12 positions shown · non-contrast
Comparison: Previous exam(s).

CLINICAL DATA: Biopsy of left breast calcifications

EXAM:
3D DIAGNOSTIC LEFT MAMMOGRAM POST STEREOTACTIC BIOPSY

[L CC synth-2D]
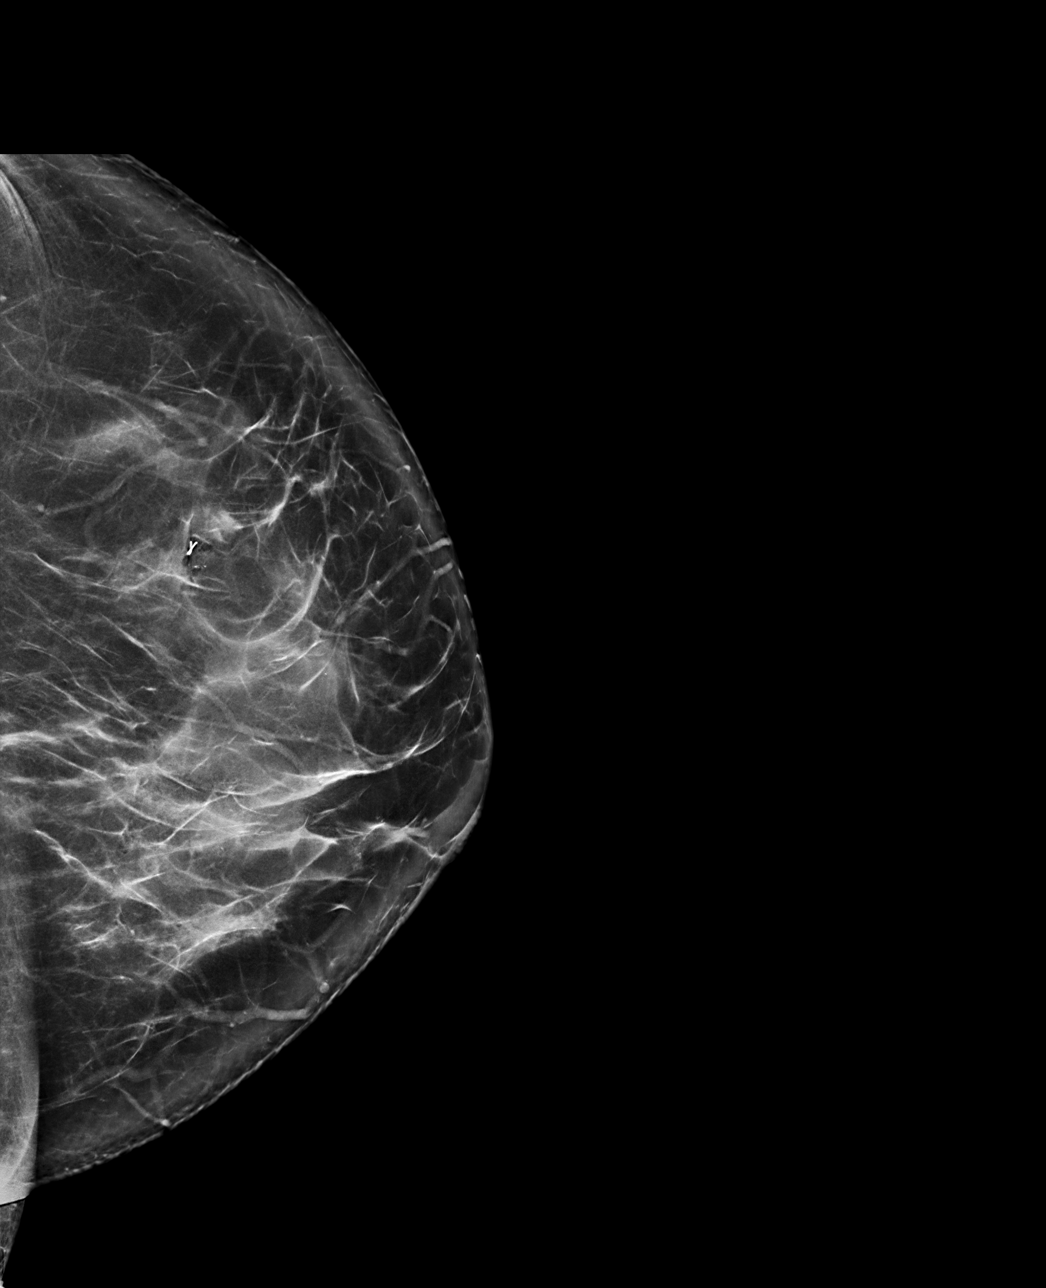

[L ML synth-2D]
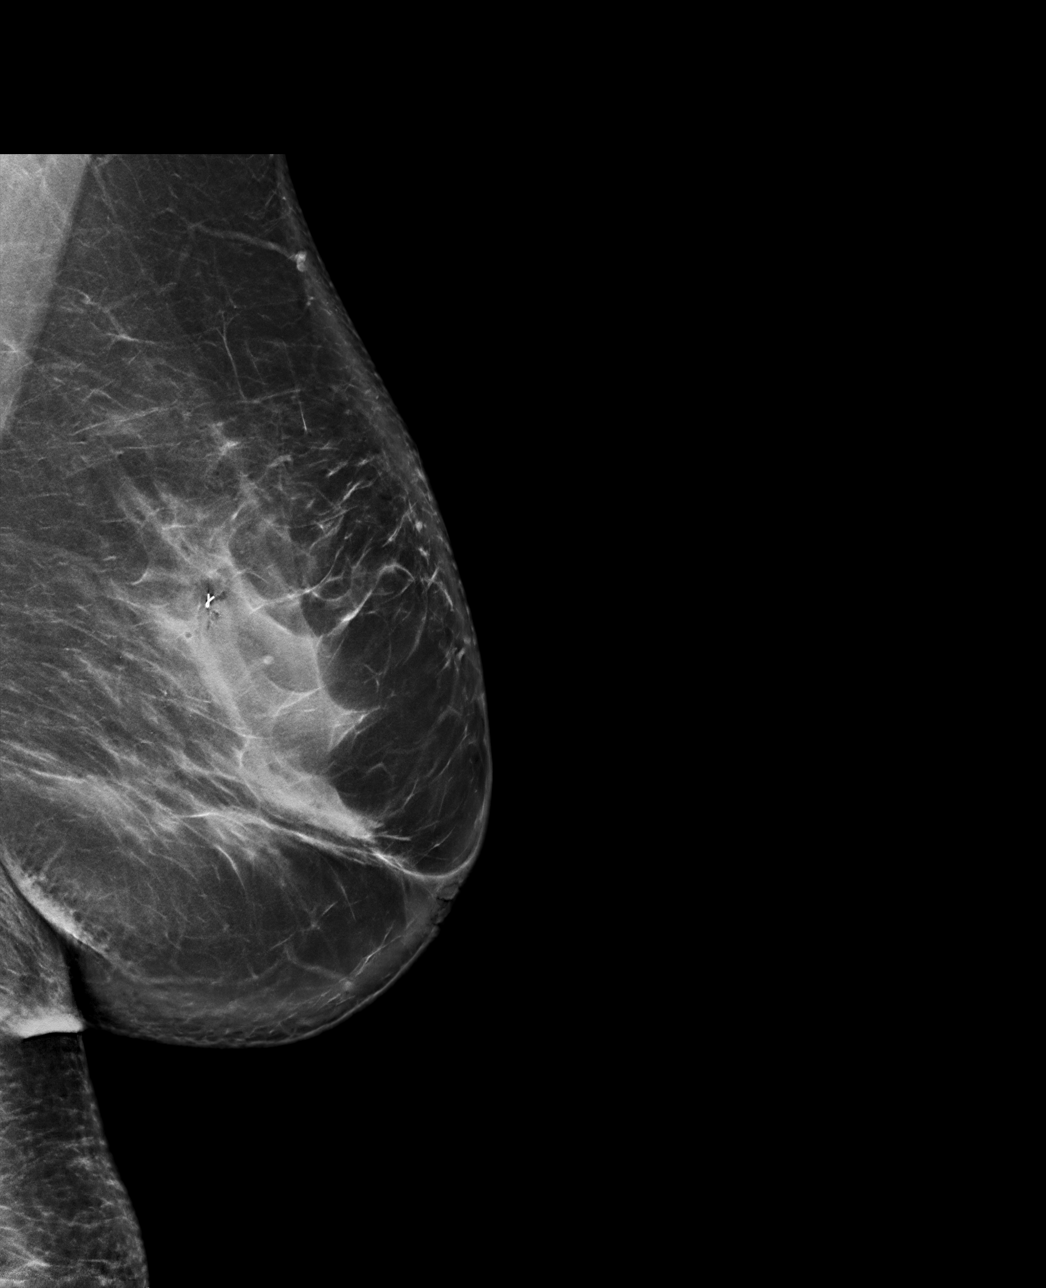

[L ML tomo · tomo slice 53/104.0]
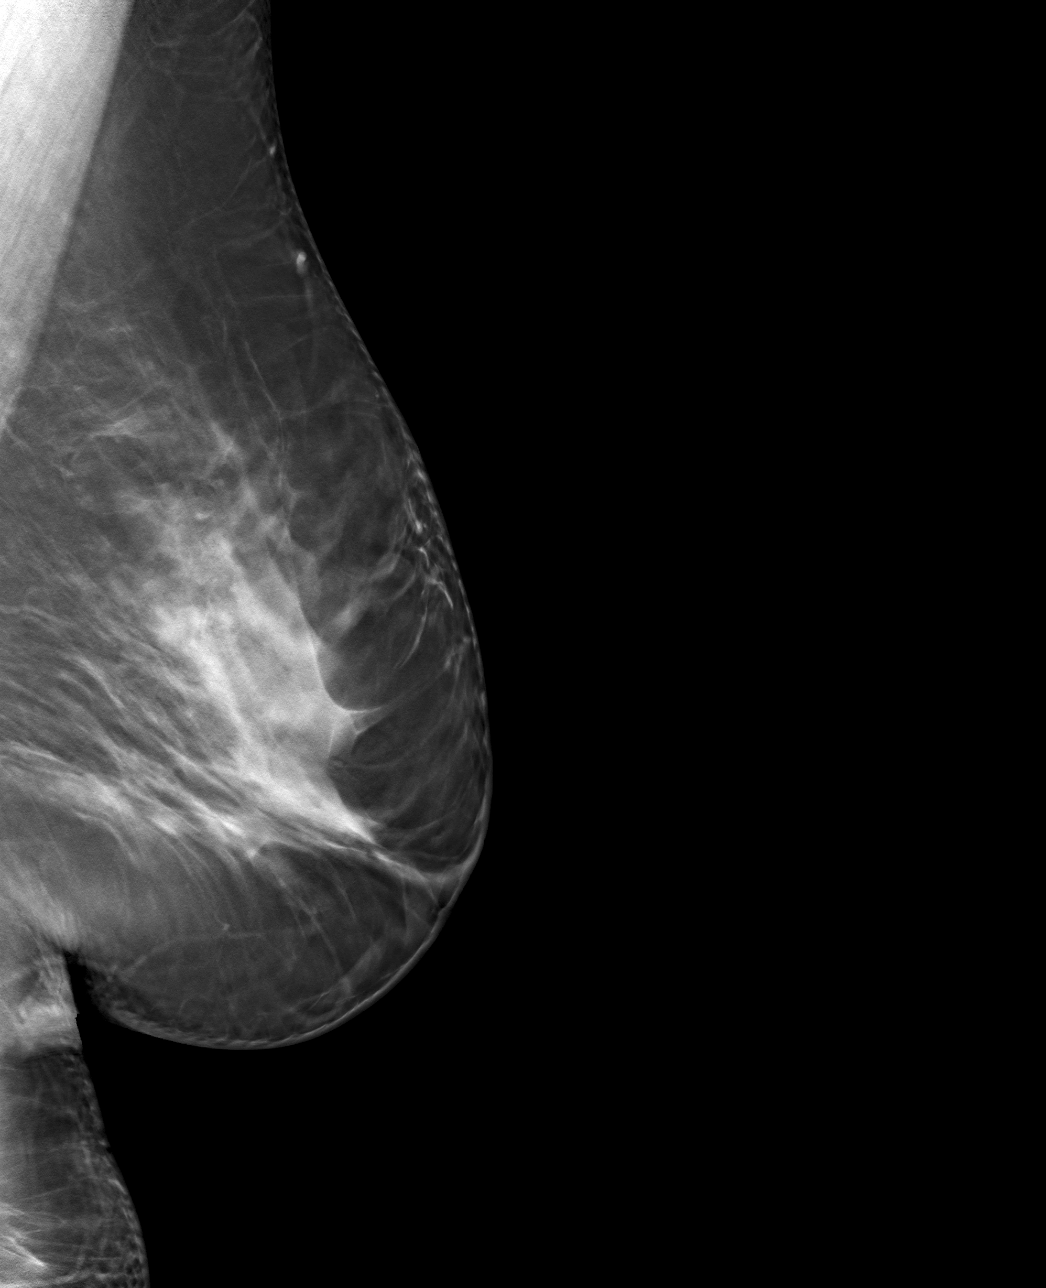

[L CC tomo · tomo slice 45/90.0]
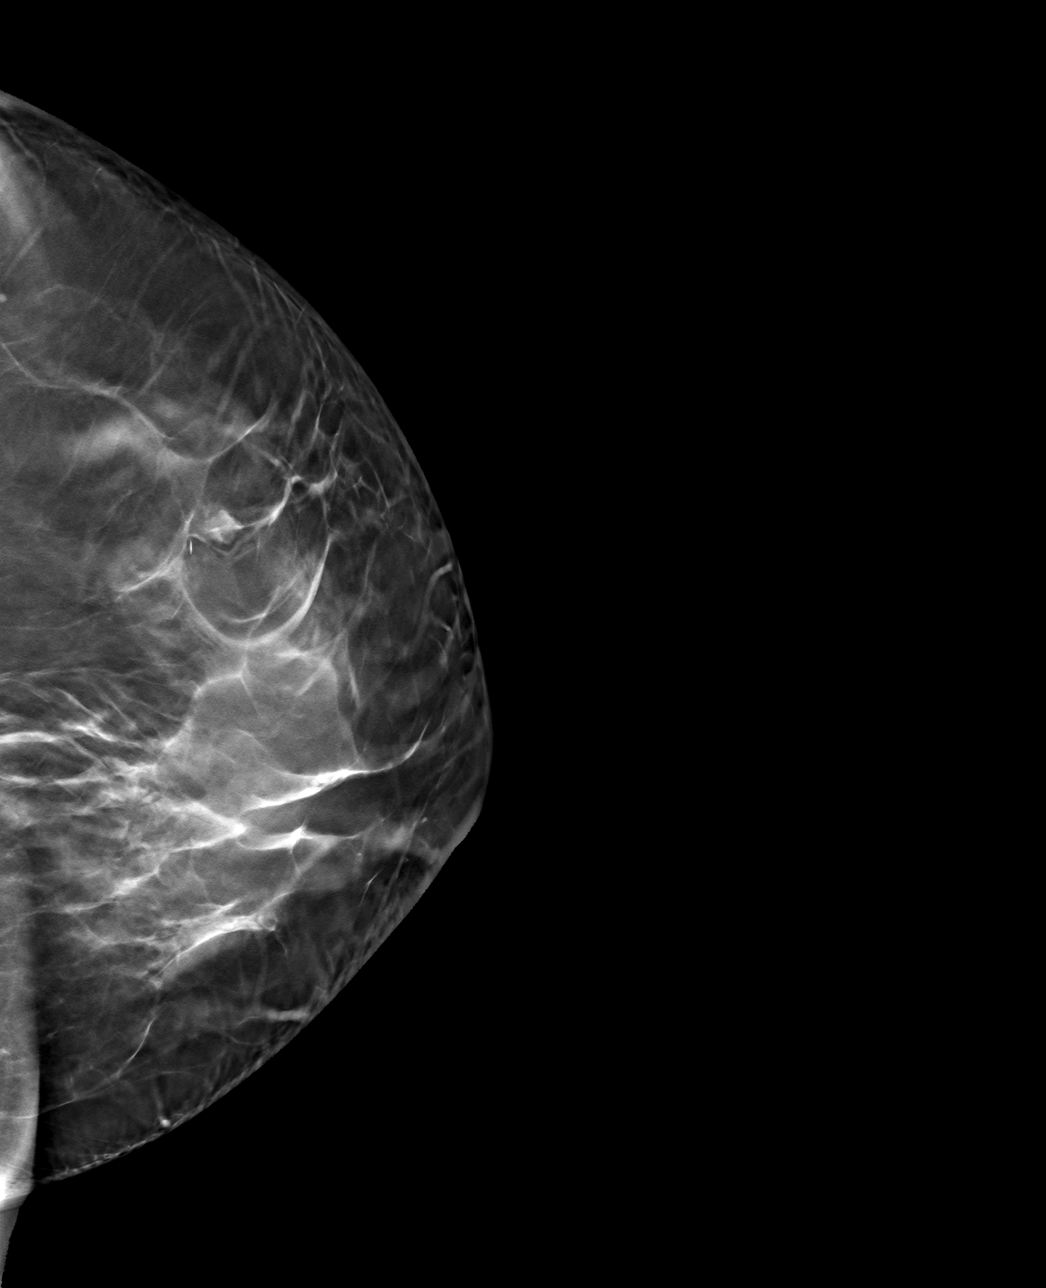

[4 of 12 positions shown; findings below may reference images not displayed]

FINDINGS: 3D Mammographic images were obtained following stereotactic guided
biopsy of left breast calcifications. The biopsy marking clip is in
expected position at the site of biopsy.
IMPRESSION: Appropriate positioning of the ribbon shaped biopsy marking clip at
the site of biopsy in the location of the biopsied calcifications.

Final Assessment: Post Procedure Mammograms for Marker Placement

## 2024-03-19 IMAGING — MG MM BREAST BX W LOC DEV 1ST LESION IMAGE BX SPEC STEREO GUIDE*L*
5 of 18 series · 5 of 40 positions shown · non-contrast
Comparison: Previous exams.
COMPARISON: Previous exams.
COMPARISON: Previous exams.

Addendum:
CLINICAL DATA: Stereotactic biopsy of left breast calcifications in
the upper outer quadrant.

EXAM:
LEFT BREAST STEREOTACTIC CORE NEEDLE BIOPSY

[L (1 of 3)]
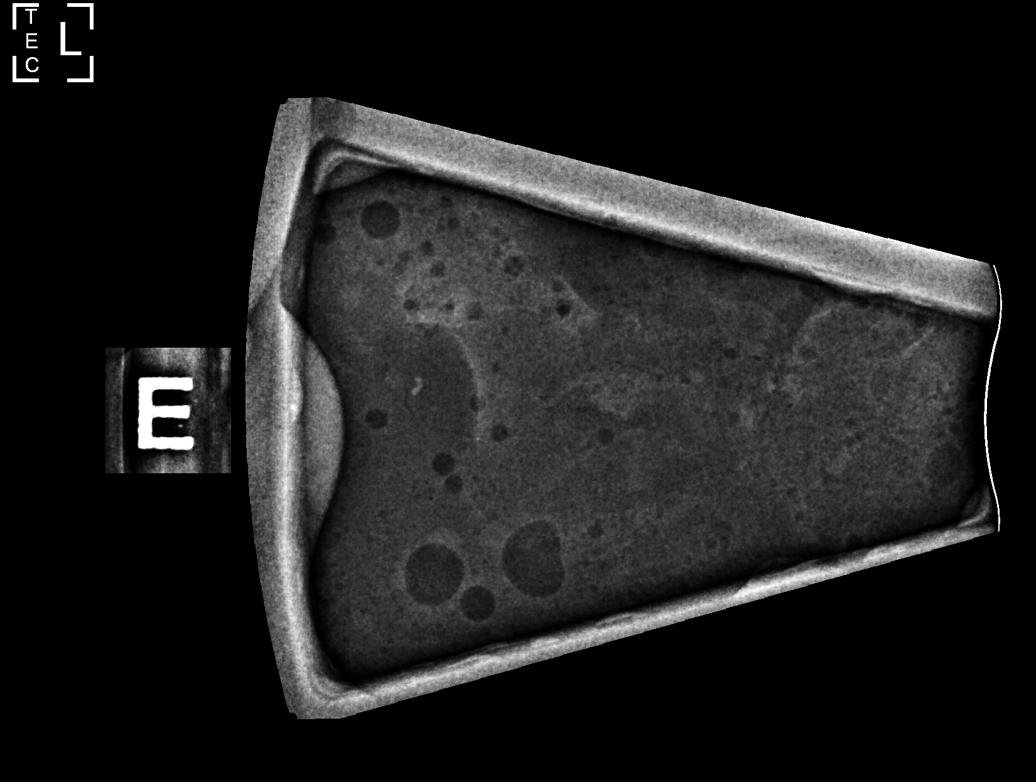

[L (2 of 3)]
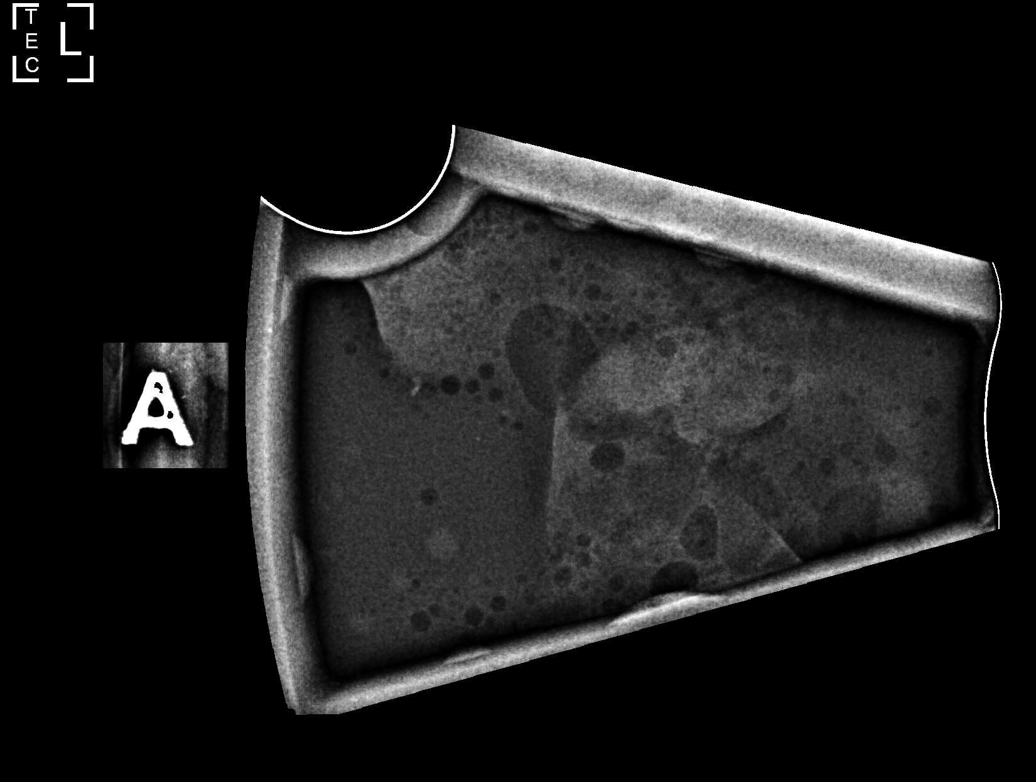

[L (3 of 3)]
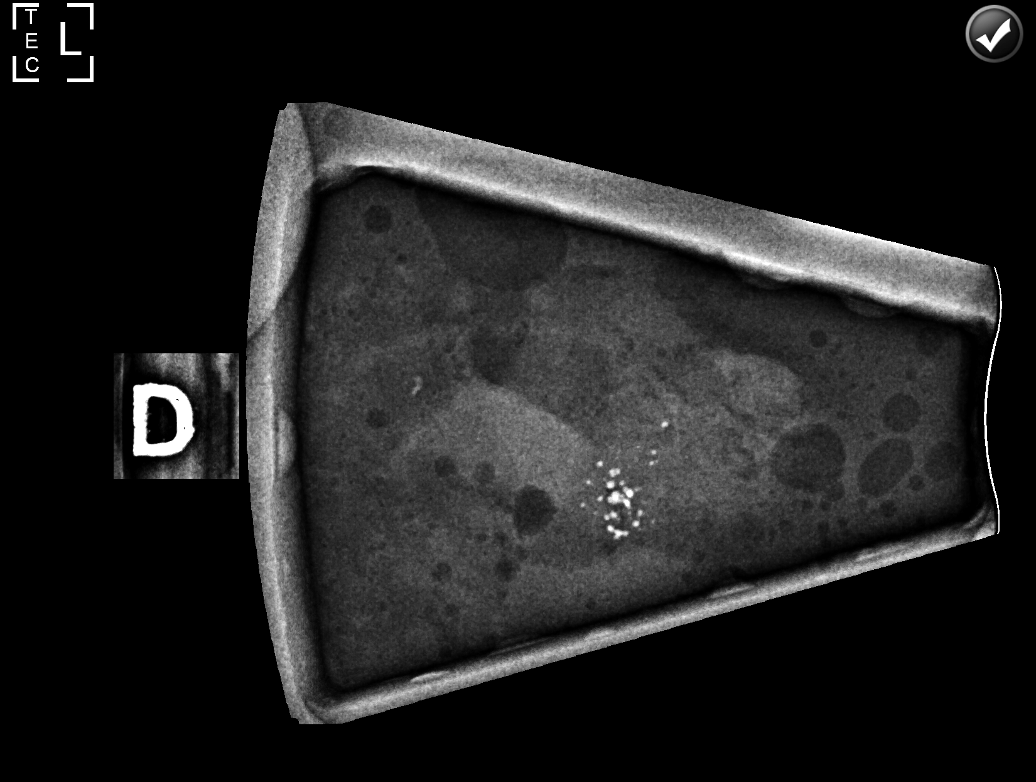

[L CC]
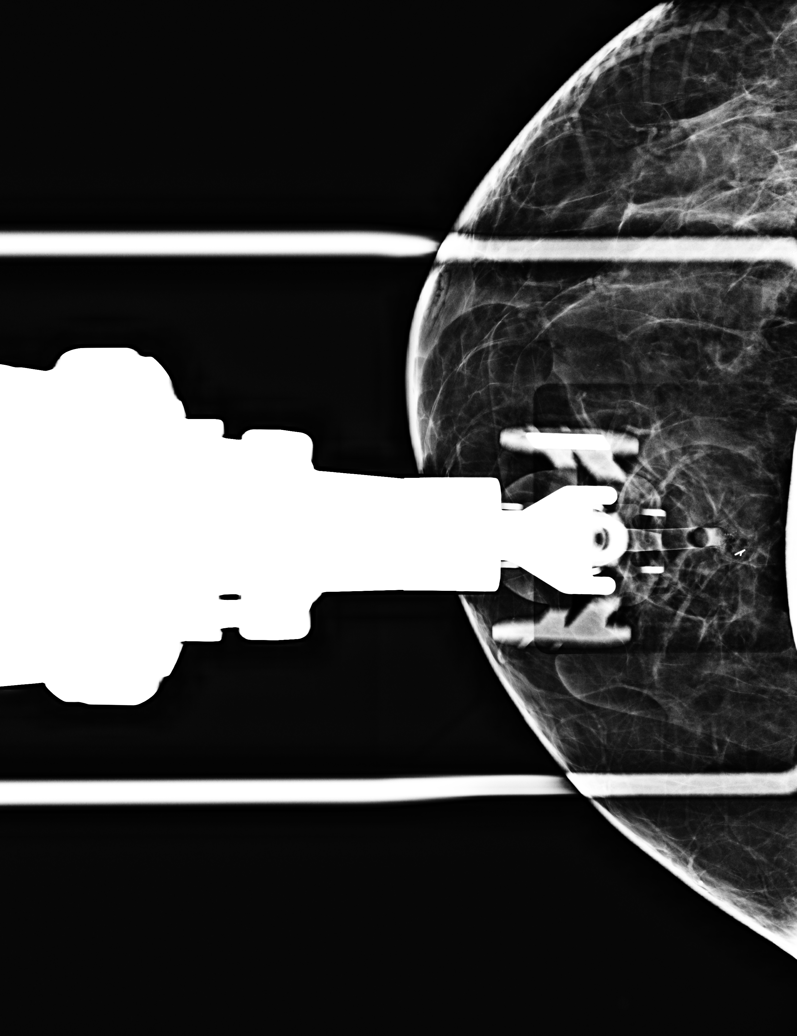

[L ML synth-2D]
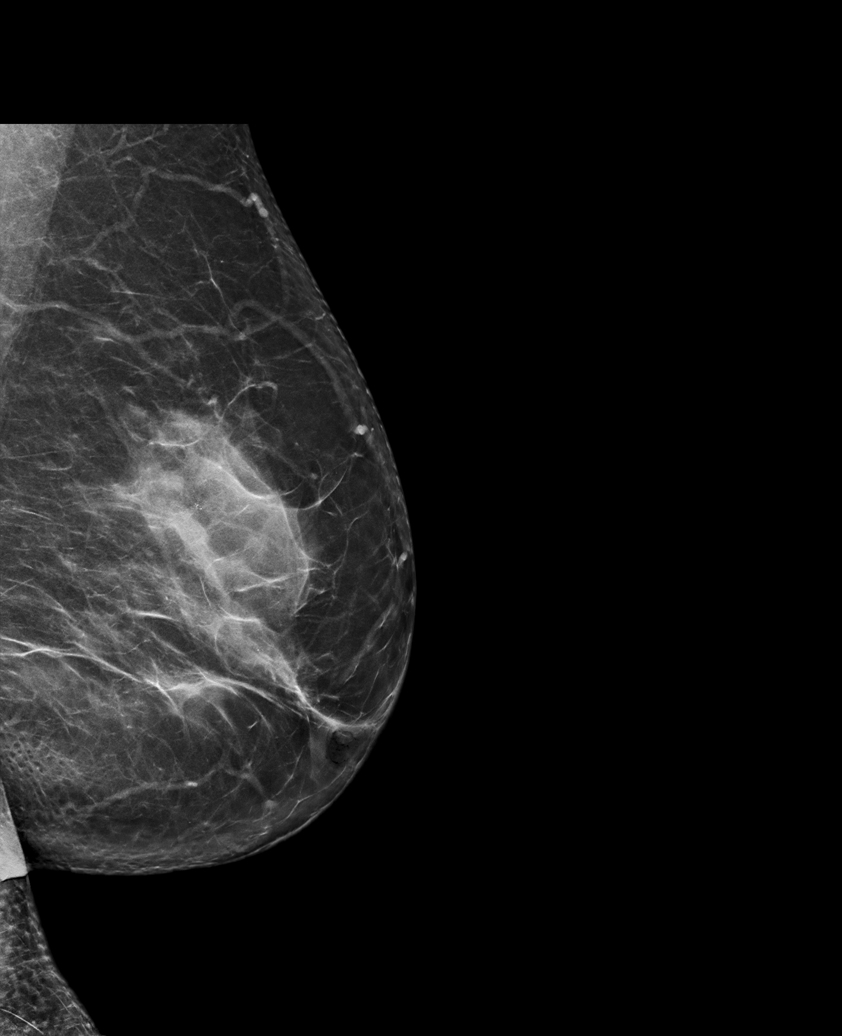

[5 of 40 positions shown; findings below may reference images not displayed]



Using sterile technique and 1% Lidocaine as local anesthetic, under
stereotactic guidance, a 9 gauge vacuum assisted device was used to
perform core needle biopsy of calcifications in the upper-outer
quadrant of the left breast using a superior approach. Specimen
radiograph was performed showing calcifications in several core
specimens. Specimens with calcifications are identified for
pathology.

Lesion quadrant: Upper outer quadrant left breast

At the conclusion of the procedure, ribbon shaped tissue marker clip
was deployed into the biopsy cavity. Follow-up 2-view mammogram was
performed and dictated separately.
IMPRESSION: Stereotactic-guided biopsy of calcifications in the upper-outer left
breast. No apparent complications.

ADDENDUM:
The patient also presented for biopsy of an asymmetry today. After
many attempts, the asymmetry was pulled into view and had the
appearance of normal glandular tissue. None the less, we attempted
to biopsy the assymetry. Once the needle was in place and repeat
imaging was obtained, it was clear the patient had pulled out
several inches. We repositioned the patient and were unable to see
the asymmetry due to obscuring lidocaine. The biopsy of the
asymmetry was canceled. If the biopsied calcifications are benign,
recommend six-month follow-up of the asymmetry which appear to
represent an island of glandular tissue on today's imaging. If the
biopsied calcifications result the need for surgery, recommend
re-attempting biopsy of the left asymmetry.

ADDENDUM:
Pathology revealed FIBROCYSTIC CHANGE WITH ADENOSIS AND
CALCIFICATIONS of the LEFT breast, upper outer quadrant, (ribbon
clip). This was found to be concordant by Dr. Treasa Vaccaro.

Pathology results were discussed with the patient by telephone by
patient reported doing well after the biopsy with tenderness at the
site. Post biopsy instructions and care were reviewed and questions
were answered. The patient was encouraged to call The [REDACTED]

Recommend six-month follow-up diagnostic mammogram of the LEFT
breast asymmetry which appears to represent an island of glandular
tissue on [HOSPITAL] the time of the biopsy for calcifications on
January 29, 2022.

Pathology results reported by Steele Leconte, RN on 02/04/2022.

*** End of Addendum ***
Addendum:
FINDINGS: The patient and I discussed the procedure of stereotactic-guided
biopsy including benefits and alternatives. We discussed the high
likelihood of a successful procedure. We discussed the risks of the
procedure including infection, bleeding, tissue injury, clip
migration, and inadequate sampling. Informed written consent was
given. The usual time out protocol was performed immediately prior
to the procedure.

Using sterile technique and 1% Lidocaine as local anesthetic, under
stereotactic guidance, a 9 gauge vacuum assisted device was used to
perform core needle biopsy of calcifications in the upper-outer
quadrant of the left breast using a superior approach. Specimen
radiograph was performed showing calcifications in several core
specimens. Specimens with calcifications are identified for
pathology.

Lesion quadrant: Upper outer quadrant left breast

At the conclusion of the procedure, ribbon shaped tissue marker clip
was deployed into the biopsy cavity. Follow-up 2-view mammogram was
performed and dictated separately.
IMPRESSION: Stereotactic-guided biopsy of calcifications in the upper-outer left
breast. No apparent complications.

ADDENDUM:
The patient also presented for biopsy of an asymmetry today. After
many attempts, the asymmetry was pulled into view and had the
appearance of normal glandular tissue. None the less, we attempted
to biopsy the assymetry. Once the needle was in place and repeat
imaging was obtained, it was clear the patient had pulled out
several inches. We repositioned the patient and were unable to see
the asymmetry due to obscuring lidocaine. The biopsy of the
asymmetry was canceled. If the biopsied calcifications are benign,
recommend six-month follow-up of the asymmetry which appear to
represent an island of glandular tissue on today's imaging. If the
biopsied calcifications result the need for surgery, recommend
re-attempting biopsy of the left asymmetry.



Using sterile technique and 1% Lidocaine as local anesthetic, under
stereotactic guidance, a 9 gauge vacuum assisted device was used to
perform core needle biopsy of calcifications in the upper-outer
quadrant of the left breast using a superior approach. Specimen
radiograph was performed showing calcifications in several core
specimens. Specimens with calcifications are identified for
pathology.

Lesion quadrant: Upper outer quadrant left breast

At the conclusion of the procedure, ribbon shaped tissue marker clip
was deployed into the biopsy cavity. Follow-up 2-view mammogram was
performed and dictated separately.
IMPRESSION: Stereotactic-guided biopsy of calcifications in the upper-outer left
breast. No apparent complications.
